# Patient Record
Sex: Male | Born: 1970 | Race: White | Hispanic: No
Health system: Southern US, Community
[De-identification: ages and names within clinical notes are randomized; demographics above are authoritative.]

---

## 2004-03-10 ENCOUNTER — Emergency Department (HOSPITAL_COMMUNITY): Admission: EM | Admit: 2004-03-10 | Discharge: 2004-03-10 | Payer: Self-pay | Admitting: Emergency Medicine

## 2015-05-31 ENCOUNTER — Encounter (INDEPENDENT_AMBULATORY_CARE_PROVIDER_SITE_OTHER): Payer: Self-pay

## 2020-05-09 ENCOUNTER — Other Ambulatory Visit (HOSPITAL_BASED_OUTPATIENT_CLINIC_OR_DEPARTMENT_OTHER): Payer: Self-pay

## 2020-05-10 ENCOUNTER — Ambulatory Visit: Payer: Self-pay | Attending: Internal Medicine

## 2020-05-10 ENCOUNTER — Other Ambulatory Visit (HOSPITAL_BASED_OUTPATIENT_CLINIC_OR_DEPARTMENT_OTHER): Payer: Self-pay

## 2020-05-10 ENCOUNTER — Other Ambulatory Visit: Payer: Self-pay

## 2020-05-10 DIAGNOSIS — Z23 Encounter for immunization: Secondary | ICD-10-CM

## 2020-05-10 MED ORDER — COVID-19 AD26 VACCINE(JANSSEN) 0.5 ML IM SUSP
INTRAMUSCULAR | 0 refills | Status: AC
  Filled 2020-05-10: qty 0.5, 1d supply, fill #0

## 2020-05-10 NOTE — Progress Notes (Signed)
   Covid-19 Vaccination Clinic  Name:  Stuart Powell    MRN: [id number] DOB: Feb 07, 1970  05/10/2020  [name] was observed post Covid-19 immunization for 15 minutes without incident. He was provided with Vaccine Information Sheet and instruction to access the V-Safe system.   [name] was instructed to call [phone] with any severe reactions post vaccine: Marland Kitchen Difficulty breathing  . Swelling of face and throat  . A fast heartbeat  . A bad rash all over body  . Dizziness and weakness   Immunizations Administered    Name Date Dose VIS Date Route   JANSSEN COVID-19 VACCINE 05/10/2020 11:47 AM 0.5 mL 11/11/2019 Intramuscular   Manufacturer: [employer]   Lot: [id number]   NDC: [phone]

## 2021-04-18 ENCOUNTER — Other Ambulatory Visit: Payer: Self-pay

## 2021-04-18 ENCOUNTER — Ambulatory Visit (INDEPENDENT_AMBULATORY_CARE_PROVIDER_SITE_OTHER): Payer: No Typology Code available for payment source

## 2021-04-18 ENCOUNTER — Ambulatory Visit (INDEPENDENT_AMBULATORY_CARE_PROVIDER_SITE_OTHER): Payer: No Typology Code available for payment source | Admitting: Orthopaedic Surgery

## 2021-04-18 VITALS — Ht 70.5 in | Wt 236.6 lb

## 2021-04-18 DIAGNOSIS — M25561 Pain in right knee: Secondary | ICD-10-CM

## 2021-04-18 DIAGNOSIS — G8929 Other chronic pain: Secondary | ICD-10-CM

## 2021-04-18 DIAGNOSIS — M25461 Effusion, right knee: Secondary | ICD-10-CM

## 2021-04-18 MED ORDER — LIDOCAINE HCL 1 % IJ SOLN
3.0000 mL | INTRAMUSCULAR | Status: AC | PRN
  Administered 2021-04-18: 3 mL

## 2021-04-18 MED ORDER — METHYLPREDNISOLONE ACETATE 40 MG/ML IJ SUSP
40.0000 mg | INTRAMUSCULAR | Status: AC | PRN
  Administered 2021-04-18: 40 mg via INTRA_ARTICULAR

## 2021-04-18 NOTE — Progress Notes (Signed)
? ?Office Visit Note ?  ?Patient: Stuart Powell           ?Date of Birth: 19-Aug-1970           ?MRN: [id number] ?Visit Date: 04/18/2021 ?             ?Requested by: No referring provider defined for this encounter. ?PCP: No primary care provider on file. ? ? ?Assessment & Plan: ?Visit Diagnoses:  ?1. Acute pain of right knee   ?2. Effusion, right knee   ? ? ?Plan: I was able to aspirate 55 cc of yellow fluid from the patient's right knee that gave him relief.  I did place a steroid injection after that in the right knee which gave him relief as well.  Given the large effusion combined with locking catching and his clinical exam, a MRI is warranted of the right knee to rule out a meniscal tear.  We will see him back in follow-up once we have an MRI of his right knee.  He will avoid high impact aerobic activities until then.  All questions and concerns were answered and addressed. ? ?Follow-Up Instructions: Return in about 2 weeks (around 05/02/2021).  ? ?Orders:  ?Orders Placed This Encounter  ?Procedures  ? Large Joint Inj  ? XR Knee 1-2 Views Right  ? ?No orders of the defined types were placed in this encounter. ? ? ? ? Procedures: ?Large Joint Inj: R knee on 04/18/2021 8:30 AM ?Indications: diagnostic evaluation and pain ?Details: 22 G 1.5 in needle, superolateral approach ? ?Arthrogram: No ? ?Medications: 3 mL lidocaine 1 %; 40 mg methylPREDNISolone acetate 40 MG/ML ?Outcome: tolerated well, no immediate complications ?Procedure, treatment alternatives, risks and benefits explained, specific risks discussed. Consent was given by the patient. Immediately prior to procedure a time out was called to verify the correct patient, procedure, equipment, support staff and site/side marked as required. Patient was prepped and draped in the usual sterile fashion.  ? ? ? ? ?Clinical Data: ?No additional findings. ? ? ?Subjective: ?Chief Complaint  ?Patient presents with  ? Right Knee - Pain  ?The patient is a 51 year old  gentleman who comes in for evaluation treatment of 2 to 3 weeks of right knee pain and swelling after he was running.  He felt a pop in his knee.  Its been swelling since then and having pain in the posterior medial aspect of the knee.  There has been locking and catching as well.  He has tried a knee brace and tried activity modification with resting the knee.  He has been on anti-inflammatories.  He is still having knee pain with locking catching in spite of conservative treatment.  He has not had previous surgery on his right knee.  He may have injured it 1 time playing softball but it is gotten significantly better and has been asymptomatic until he has gotten into running.  Again he had not had any issues until recently when he felt a pop and twisted the knee. ? ?HPI ? ?Review of Systems ?There is currently listed no headache, chest pain, shortness of breath, fever, chills, nausea, vomiting ? ?Objective: ?Vital Signs: Ht 5' 10.5" (1.791 m)   Wt 236 lb 9.6 oz (107.3 kg)   BMI 33.47 kg/m?  ? ?Physical Exam ?He is alert and orient x3 and in no acute distress ?Ortho Exam ?Examination of his right knee shows a large effusion.  He has posterior medial pain and a positive McMurray's exam to the medial  compartment of his right knee.  There is varus malalignment.  His range of motion is full but very painful past 90 degrees of flexion in the posterior aspect of his right knee.22 ?Specialty Comments:  ?No specialty comments available. ? ?Imaging: ?XR Knee 1-2 Views Right ? ?Result Date: 04/18/2021 ?2 views of the right knee show no acute findings.  There is slight varus malalignment with medial joint space narrowing.  There is a mild effusion.  ? ? ?PMFS History: ?There are no problems to display for this patient. ? ?Social History  ? ?Occupational History  ? Not on file  ?Tobacco Use  ? Smoking status: Not on file  ? Smokeless tobacco: Not on file  ?Substance and Sexual Activity  ? Alcohol use: Not on file  ? Drug use:  Not on file  ? Sexual activity: Not on file  ? ? ? ? ? ? ?

## 2021-04-25 ENCOUNTER — Ambulatory Visit
Admission: RE | Admit: 2021-04-25 | Discharge: 2021-04-25 | Disposition: A | Discharge status: Home / Self Care | Payer: No Typology Code available for payment source | Source: Ambulatory Visit | Attending: Orthopaedic Surgery | Admitting: Orthopaedic Surgery

## 2021-04-25 DIAGNOSIS — G8929 Other chronic pain: Secondary | ICD-10-CM

## 2021-04-26 ENCOUNTER — Other Ambulatory Visit: Payer: No Typology Code available for payment source

## 2021-05-02 ENCOUNTER — Encounter: Payer: Self-pay | Admitting: Orthopaedic Surgery

## 2021-05-02 ENCOUNTER — Ambulatory Visit (INDEPENDENT_AMBULATORY_CARE_PROVIDER_SITE_OTHER): Payer: No Typology Code available for payment source | Admitting: Orthopaedic Surgery

## 2021-05-02 DIAGNOSIS — M25461 Effusion, right knee: Secondary | ICD-10-CM | POA: Diagnosis not present

## 2021-05-02 DIAGNOSIS — S83241D Other tear of medial meniscus, current injury, right knee, subsequent encounter: Secondary | ICD-10-CM | POA: Diagnosis not present

## 2021-05-02 DIAGNOSIS — M25561 Pain in right knee: Secondary | ICD-10-CM

## 2021-05-02 NOTE — Progress Notes (Signed)
Patient is a active 51 year old gentleman who comes in today to go over MRI of his right knee.  He has been training and running and he felt a pop in his knee.  When he came in to see me had a large joint effusion drained at least 50+ cc of fluid off the knee.  A steroid injection was then placed.  He says he feels much better overall and right now his right knee is not problematic.  We did send in for the MRI though given the large joint effusion and the mechanical symptoms he was having. ? ?On exam he just has a mild effusion today with excellent range of motion of his right knee. ? ?MRI of his right knee does show a complex medial meniscal tear from the posterior horn to mid body with a flap component.  He has some thinning of the articular cartilage on that medial aspect of his knee and the remainder of his knee is entirely normal. ? ?I showed him a knee model and explained in detail what it means to have an extensive tear of his meniscus.  We are recommending at some point arthroscopic surgery if he becomes more symptomatic.  He needs to avoid pivoting activities and high impact aerobic activities in the interim.  All questions and concerns were answered and addressed.  If he becomes more symptomatic he will let us know. ?

## 2022-03-03 ENCOUNTER — Emergency Department (HOSPITAL_COMMUNITY)
Admission: EM | Admit: 2022-03-03 | Discharge: 2022-03-03 | Disposition: A | Discharge status: Home / Self Care | Payer: Self-pay | Attending: Emergency Medicine | Admitting: Emergency Medicine

## 2022-03-03 ENCOUNTER — Emergency Department (HOSPITAL_COMMUNITY): Payer: Self-pay

## 2022-03-03 ENCOUNTER — Other Ambulatory Visit: Payer: Self-pay

## 2022-03-03 ENCOUNTER — Encounter (HOSPITAL_COMMUNITY): Payer: Self-pay | Admitting: Emergency Medicine

## 2022-03-03 DIAGNOSIS — M79642 Pain in left hand: Secondary | ICD-10-CM | POA: Insufficient documentation

## 2022-03-03 DIAGNOSIS — M79644 Pain in right finger(s): Secondary | ICD-10-CM | POA: Insufficient documentation

## 2022-03-03 DIAGNOSIS — Y9355 Activity, bike riding: Secondary | ICD-10-CM | POA: Insufficient documentation

## 2022-03-03 DIAGNOSIS — S01111A Laceration without foreign body of right eyelid and periocular area, initial encounter: Secondary | ICD-10-CM | POA: Insufficient documentation

## 2022-03-03 DIAGNOSIS — S0990XA Unspecified injury of head, initial encounter: Secondary | ICD-10-CM | POA: Insufficient documentation

## 2022-03-03 DIAGNOSIS — Y92410 Unspecified street and highway as the place of occurrence of the external cause: Secondary | ICD-10-CM | POA: Insufficient documentation

## 2022-03-03 DIAGNOSIS — T07XXXA Unspecified multiple injuries, initial encounter: Secondary | ICD-10-CM

## 2022-03-03 DIAGNOSIS — S0181XA Laceration without foreign body of other part of head, initial encounter: Secondary | ICD-10-CM

## 2022-03-03 MED ORDER — LIDOCAINE HCL 2 % IJ SOLN
10.0000 mL | Freq: Once | INTRAMUSCULAR | Status: AC
  Administered 2022-03-03: 200 mg
  Filled 2022-03-03: qty 20

## 2022-03-03 MED ORDER — TETANUS-DIPHTH-ACELL PERTUSSIS 5-2.5-18.5 LF-MCG/0.5 IM SUSY
0.5000 mL | PREFILLED_SYRINGE | Freq: Once | INTRAMUSCULAR | Status: DC
  Filled 2022-03-03: qty 0.5

## 2022-03-03 NOTE — Care Management (Signed)
PCP placed on patient instructions

## 2022-03-03 NOTE — ED Notes (Signed)
Patient transported to CT 
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
## 2022-03-03 NOTE — ED Notes (Signed)
Pt refused DC VS and stated "he is ready to go: Pt also refused x-ray.

## 2022-03-03 NOTE — Discharge Instructions (Signed)
Tylenol for pain.  Ice to areas of swelling.  Return if any problems.
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
